# Patient Record
Sex: Female | Born: 1995 | Hispanic: Yes | Marital: Single | Smoking: Never smoker
Health system: Southern US, Community
[De-identification: ages and names within clinical notes are randomized; demographics above are authoritative.]

## PROBLEM LIST (undated history)

## (undated) SURGERY — OPEN REDUCTION INTERNAL FIXATION (ORIF) ANKLE FRACTURE
Anesthesia: Choice | Laterality: Right

---
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
## 2015-06-29 ENCOUNTER — Emergency Department (HOSPITAL_COMMUNITY): Payer: No Typology Code available for payment source

## 2015-06-29 ENCOUNTER — Encounter (HOSPITAL_COMMUNITY): Payer: Self-pay | Admitting: *Deleted

## 2015-06-29 ENCOUNTER — Emergency Department (HOSPITAL_COMMUNITY): Payer: No Typology Code available for payment source | Admitting: Certified Registered"

## 2015-06-29 ENCOUNTER — Encounter (HOSPITAL_COMMUNITY)
Admission: EM | Disposition: A | Discharge status: Home / Self Care | Payer: Self-pay | Source: Home / Self Care | Attending: Emergency Medicine

## 2015-06-29 ENCOUNTER — Observation Stay (HOSPITAL_COMMUNITY)
Admission: EM | Admit: 2015-06-29 | Discharge: 2015-07-01 | Disposition: A | Discharge status: Home / Self Care | Payer: No Typology Code available for payment source | Attending: Orthopedic Surgery | Admitting: Orthopedic Surgery

## 2015-06-29 DIAGNOSIS — R10819 Abdominal tenderness, unspecified site: Secondary | ICD-10-CM | POA: Insufficient documentation

## 2015-06-29 DIAGNOSIS — S82891B Other fracture of right lower leg, initial encounter for open fracture type I or II: Secondary | ICD-10-CM | POA: Diagnosis present

## 2015-06-29 DIAGNOSIS — S82451B Displaced comminuted fracture of shaft of right fibula, initial encounter for open fracture type I or II: Secondary | ICD-10-CM | POA: Diagnosis not present

## 2015-06-29 DIAGNOSIS — S82851B Displaced trimalleolar fracture of right lower leg, initial encounter for open fracture type I or II: Secondary | ICD-10-CM

## 2015-06-29 DIAGNOSIS — Z9889 Other specified postprocedural states: Secondary | ICD-10-CM

## 2015-06-29 DIAGNOSIS — R112 Nausea with vomiting, unspecified: Secondary | ICD-10-CM | POA: Insufficient documentation

## 2015-06-29 DIAGNOSIS — S82401B Unspecified fracture of shaft of right fibula, initial encounter for open fracture type I or II: Secondary | ICD-10-CM | POA: Diagnosis present

## 2015-06-29 HISTORY — PX: ORIF FIBULA FRACTURE: SHX5114

## 2015-06-29 LAB — I-STAT CHEM 8, ED
BUN: 12 mg/dL (ref 6–20)
CALCIUM ION: 1.2 mmol/L (ref 1.12–1.23)
CHLORIDE: 102 mmol/L (ref 101–111)
Creatinine, Ser: 0.9 mg/dL (ref 0.44–1.00)
GLUCOSE: 99 mg/dL (ref 65–99)
HCT: 36 % (ref 36.0–46.0)
Hemoglobin: 12.2 g/dL (ref 12.0–15.0)
Potassium: 4 mmol/L (ref 3.5–5.1)
Sodium: 139 mmol/L (ref 135–145)
TCO2: 25 mmol/L (ref 0–100)

## 2015-06-29 LAB — CBC WITH DIFFERENTIAL/PLATELET
BASOS PCT: 0 %
Basophils Absolute: 0 10*3/uL (ref 0.0–0.1)
EOS PCT: 1 %
Eosinophils Absolute: 0.1 10*3/uL (ref 0.0–0.7)
HCT: 33 % — ABNORMAL LOW (ref 36.0–46.0)
Hemoglobin: 10.2 g/dL — ABNORMAL LOW (ref 12.0–15.0)
LYMPHS ABS: 1.6 10*3/uL (ref 0.7–4.0)
Lymphocytes Relative: 18 %
MCH: 21.1 pg — AB (ref 26.0–34.0)
MCHC: 30.9 g/dL (ref 30.0–36.0)
MCV: 68.2 fL — AB (ref 78.0–100.0)
MONO ABS: 0.5 10*3/uL (ref 0.1–1.0)
Monocytes Relative: 6 %
NEUTROS ABS: 6.7 10*3/uL (ref 1.7–7.7)
Neutrophils Relative %: 75 %
PLATELETS: 239 10*3/uL (ref 150–400)
RBC: 4.84 MIL/uL (ref 3.87–5.11)
RDW: 18.2 % — AB (ref 11.5–15.5)
WBC: 8.9 10*3/uL (ref 4.0–10.5)

## 2015-06-29 LAB — I-STAT BETA HCG BLOOD, ED (MC, WL, AP ONLY)

## 2015-06-29 SURGERY — OPEN REDUCTION INTERNAL FIXATION (ORIF) FIBULA FRACTURE
Anesthesia: General | Site: Leg Lower | Laterality: Right

## 2015-06-29 MED ORDER — PROPOFOL 10 MG/ML IV BOLUS
INTRAVENOUS | Status: DC | PRN
  Administered 2015-06-29: 200 mg via INTRAVENOUS

## 2015-06-29 MED ORDER — ONDANSETRON HCL 4 MG PO TABS
4.0000 mg | ORAL_TABLET | Freq: Four times a day (QID) | ORAL | Status: DC | PRN
  Administered 2015-06-30 (×2): 4 mg via ORAL
  Filled 2015-06-29 (×2): qty 1

## 2015-06-29 MED ORDER — METHOCARBAMOL 1000 MG/10ML IJ SOLN: 500.0000 mg | Freq: Four times a day (QID) | INTRAVENOUS | Status: DC | PRN

## 2015-06-29 MED ORDER — ONDANSETRON HCL 4 MG/2ML IJ SOLN
INTRAMUSCULAR | Status: AC
Start: 2015-06-29 — End: 2015-06-30
  Filled 2015-06-29: qty 2

## 2015-06-29 MED ORDER — OXYCODONE-ACETAMINOPHEN 5-325 MG PO TABS: 1.0000 | ORAL_TABLET | Freq: Four times a day (QID) | ORAL | Status: AC | PRN

## 2015-06-29 MED ORDER — LIDOCAINE HCL (CARDIAC) 20 MG/ML IV SOLN
INTRAVENOUS | Status: DC | PRN
  Administered 2015-06-29: 80 mg via INTRAVENOUS

## 2015-06-29 MED ORDER — HYDROMORPHONE HCL 1 MG/ML IJ SOLN
0.2500 mg | INTRAMUSCULAR | Status: DC | PRN
  Administered 2015-06-29 (×4): 0.5 mg via INTRAVENOUS

## 2015-06-29 MED ORDER — MIDAZOLAM HCL 2 MG/2ML IJ SOLN
INTRAMUSCULAR | Status: AC
  Filled 2015-06-29: qty 4

## 2015-06-29 MED ORDER — PROPOFOL 10 MG/ML IV BOLUS
INTRAVENOUS | Status: AC
  Filled 2015-06-29: qty 20

## 2015-06-29 MED ORDER — KETOROLAC TROMETHAMINE 15 MG/ML IJ SOLN
15.0000 mg | Freq: Four times a day (QID) | INTRAMUSCULAR | Status: AC
  Administered 2015-06-30 – 2015-07-01 (×4): 15 mg via INTRAVENOUS
  Filled 2015-06-29 (×4): qty 1

## 2015-06-29 MED ORDER — HYDROMORPHONE HCL 1 MG/ML IJ SOLN: 0.5000 mg | INTRAMUSCULAR | Status: DC | PRN

## 2015-06-29 MED ORDER — IBUPROFEN 400 MG PO TABS
400.0000 mg | ORAL_TABLET | Freq: Once | ORAL | Status: AC
  Administered 2015-06-29: 400 mg via ORAL
  Filled 2015-06-29: qty 1

## 2015-06-29 MED ORDER — OXYCODONE HCL 5 MG PO TABS
5.0000 mg | ORAL_TABLET | ORAL | Status: DC | PRN
  Administered 2015-06-30 – 2015-07-01 (×6): 5 mg via ORAL
  Filled 2015-06-29 (×6): qty 1

## 2015-06-29 MED ORDER — OXYCODONE HCL 5 MG PO TABS
ORAL_TABLET | ORAL | Status: AC
Start: 2015-06-29 — End: 2015-06-30
  Filled 2015-06-29: qty 1

## 2015-06-29 MED ORDER — ONDANSETRON HCL 4 MG/2ML IJ SOLN
INTRAMUSCULAR | Status: AC
  Filled 2015-06-29: qty 2

## 2015-06-29 MED ORDER — CEFAZOLIN SODIUM-DEXTROSE 2-3 GM-% IV SOLR
INTRAVENOUS | Status: AC
  Filled 2015-06-29: qty 50

## 2015-06-29 MED ORDER — SUCCINYLCHOLINE CHLORIDE 20 MG/ML IJ SOLN
INTRAMUSCULAR | Status: DC | PRN
  Administered 2015-06-29: 100 mg via INTRAVENOUS

## 2015-06-29 MED ORDER — LIDOCAINE HCL (CARDIAC) 20 MG/ML IV SOLN
INTRAVENOUS | Status: AC
  Filled 2015-06-29: qty 5

## 2015-06-29 MED ORDER — ACETAMINOPHEN 325 MG PO TABS: 650.0000 mg | ORAL_TABLET | Freq: Four times a day (QID) | ORAL | Status: DC | PRN

## 2015-06-29 MED ORDER — ACETAMINOPHEN 650 MG RE SUPP: 650.0000 mg | Freq: Four times a day (QID) | RECTAL | Status: DC | PRN

## 2015-06-29 MED ORDER — CEPHALEXIN 500 MG PO CAPS: 500.0000 mg | ORAL_CAPSULE | Freq: Three times a day (TID) | ORAL | Status: AC

## 2015-06-29 MED ORDER — MEPERIDINE HCL 25 MG/ML IJ SOLN: 6.2500 mg | INTRAMUSCULAR | Status: DC | PRN

## 2015-06-29 MED ORDER — HYDROMORPHONE HCL 1 MG/ML IJ SOLN
INTRAMUSCULAR | Status: AC
  Filled 2015-06-29: qty 1

## 2015-06-29 MED ORDER — ASPIRIN EC 325 MG PO TBEC: 325.0000 mg | DELAYED_RELEASE_TABLET | Freq: Every day | ORAL | Status: AC

## 2015-06-29 MED ORDER — OXYCODONE HCL 5 MG PO TABS
5.0000 mg | ORAL_TABLET | Freq: Once | ORAL | Status: AC | PRN
  Administered 2015-06-29: 5 mg via ORAL

## 2015-06-29 MED ORDER — FENTANYL CITRATE (PF) 250 MCG/5ML IJ SOLN
INTRAMUSCULAR | Status: DC | PRN
  Administered 2015-06-29: 100 ug via INTRAVENOUS
  Administered 2015-06-29: 50 ug via INTRAVENOUS

## 2015-06-29 MED ORDER — METHOCARBAMOL 500 MG PO TABS
500.0000 mg | ORAL_TABLET | Freq: Four times a day (QID) | ORAL | Status: DC | PRN
  Administered 2015-07-01: 500 mg via ORAL
  Filled 2015-06-29: qty 1

## 2015-06-29 MED ORDER — OXYCODONE HCL 5 MG/5ML PO SOLN
5.0000 mg | Freq: Once | ORAL | Status: AC | PRN
Start: 2015-06-29 — End: 2015-06-29

## 2015-06-29 MED ORDER — 0.9 % SODIUM CHLORIDE (POUR BTL) OPTIME
TOPICAL | Status: DC | PRN
  Administered 2015-06-29: 1000 mL

## 2015-06-29 MED ORDER — MIDAZOLAM HCL 5 MG/5ML IJ SOLN
INTRAMUSCULAR | Status: DC | PRN
  Administered 2015-06-29: 2 mg via INTRAVENOUS

## 2015-06-29 MED ORDER — METOCLOPRAMIDE HCL 5 MG PO TABS
5.0000 mg | ORAL_TABLET | Freq: Three times a day (TID) | ORAL | Status: DC | PRN
Start: 2015-06-29 — End: 2015-07-01

## 2015-06-29 MED ORDER — KETOROLAC TROMETHAMINE 30 MG/ML IJ SOLN
30.0000 mg | Freq: Once | INTRAMUSCULAR | Status: AC
  Administered 2015-06-29: 30 mg via INTRAVENOUS

## 2015-06-29 MED ORDER — FENTANYL CITRATE (PF) 250 MCG/5ML IJ SOLN
INTRAMUSCULAR | Status: AC
  Filled 2015-06-29: qty 5

## 2015-06-29 MED ORDER — BUPIVACAINE HCL (PF) 0.25 % IJ SOLN
INTRAMUSCULAR | Status: AC
  Filled 2015-06-29: qty 30

## 2015-06-29 MED ORDER — LACTATED RINGERS IV SOLN
INTRAVENOUS | Status: DC | PRN
  Administered 2015-06-29 (×2): via INTRAVENOUS

## 2015-06-29 MED ORDER — IOHEXOL 300 MG/ML  SOLN
100.0000 mL | Freq: Once | INTRAMUSCULAR | Status: AC | PRN
  Administered 2015-06-29: 100 mL via INTRAVENOUS

## 2015-06-29 MED ORDER — METOCLOPRAMIDE HCL 5 MG/ML IJ SOLN
5.0000 mg | Freq: Three times a day (TID) | INTRAMUSCULAR | Status: DC | PRN
  Administered 2015-06-30: 10 mg via INTRAVENOUS
  Filled 2015-06-29: qty 2

## 2015-06-29 MED ORDER — ONDANSETRON HCL 4 MG/2ML IJ SOLN
4.0000 mg | Freq: Once | INTRAMUSCULAR | Status: AC
  Administered 2015-06-29: 4 mg via INTRAVENOUS

## 2015-06-29 MED ORDER — ASPIRIN EC 81 MG PO TBEC
81.0000 mg | DELAYED_RELEASE_TABLET | Freq: Every day | ORAL | Status: DC
  Administered 2015-06-30 – 2015-07-01 (×2): 81 mg via ORAL
  Filled 2015-06-29 (×2): qty 1

## 2015-06-29 MED ORDER — CEFAZOLIN SODIUM 1-5 GM-% IV SOLN
1.0000 g | Freq: Three times a day (TID) | INTRAVENOUS | Status: DC
  Administered 2015-06-29: 2 g via INTRAVENOUS
  Filled 2015-06-29: qty 50

## 2015-06-29 MED ORDER — HYDROMORPHONE HCL 1 MG/ML IJ SOLN
INTRAMUSCULAR | Status: AC
Start: 2015-06-29 — End: 2015-06-30
  Filled 2015-06-29: qty 1

## 2015-06-29 MED ORDER — ONDANSETRON HCL 4 MG/2ML IJ SOLN
4.0000 mg | Freq: Four times a day (QID) | INTRAMUSCULAR | Status: DC | PRN
  Administered 2015-06-30: 4 mg via INTRAVENOUS
  Filled 2015-06-29: qty 2

## 2015-06-29 MED ORDER — KETOROLAC TROMETHAMINE 30 MG/ML IJ SOLN
INTRAMUSCULAR | Status: AC
  Filled 2015-06-29: qty 1

## 2015-06-29 MED ORDER — FENTANYL CITRATE (PF) 100 MCG/2ML IJ SOLN: 25.0000 ug | Freq: Once | INTRAMUSCULAR | Status: DC

## 2015-06-29 MED ORDER — BUPIVACAINE HCL (PF) 0.25 % IJ SOLN
INTRAMUSCULAR | Status: DC | PRN
  Administered 2015-06-29: 30 mL

## 2015-06-29 MED ORDER — ONDANSETRON HCL 4 MG/2ML IJ SOLN
INTRAMUSCULAR | Status: DC | PRN
  Administered 2015-06-29 (×2): 4 mg via INTRAVENOUS

## 2015-06-29 MED ORDER — CEFAZOLIN SODIUM 1-5 GM-% IV SOLN
1.0000 g | Freq: Four times a day (QID) | INTRAVENOUS | Status: DC
  Administered 2015-06-30 – 2015-07-01 (×5): 1 g via INTRAVENOUS
  Filled 2015-06-29 (×7): qty 50

## 2015-06-29 MED ORDER — DEXTROSE-NACL 5-0.45 % IV SOLN
INTRAVENOUS | Status: DC
  Administered 2015-06-30 (×2): via INTRAVENOUS

## 2015-06-29 MED ORDER — TETANUS-DIPHTH-ACELL PERTUSSIS 5-2.5-18.5 LF-MCG/0.5 IM SUSP
0.5000 mL | Freq: Once | INTRAMUSCULAR | Status: AC
  Administered 2015-06-29: 0.5 mL via INTRAMUSCULAR
  Filled 2015-06-29: qty 0.5

## 2015-06-29 SURGICAL SUPPLY — 65 items
BANDAGE ELASTIC 4 VELCRO ST LF (GAUZE/BANDAGES/DRESSINGS) ×3 IMPLANT
BANDAGE ELASTIC 6 VELCRO ST LF (GAUZE/BANDAGES/DRESSINGS) ×3 IMPLANT
BANDAGE ESMARK 6X9 LF (GAUZE/BANDAGES/DRESSINGS) ×1 IMPLANT
BIT DRILL 2.0 (BIT) ×1
BIT DRILL 2.0MM (BIT) ×1
BIT DRILL 2.5X2.75 QC CALB (BIT) ×3 IMPLANT
BIT DRILL 2XNS DISP SS SM FRAG (BIT) ×1 IMPLANT
BIT DRILL 3.5X5.5 QC CALB (BIT) ×3 IMPLANT
BIT DRILL CALIBRATED 2.7 (BIT) ×2 IMPLANT
BIT DRILL CALIBRATED 2.7MM (BIT) ×1
BIT DRL 2XNS DISP SS SM FRAG (BIT) ×1
BLADE SURG 10 STRL SS (BLADE) IMPLANT
BLADE SURG ROTATE 9660 (MISCELLANEOUS) IMPLANT
BNDG ESMARK 6X9 LF (GAUZE/BANDAGES/DRESSINGS) ×3
BNDG GAUZE ELAST 4 BULKY (GAUZE/BANDAGES/DRESSINGS) ×3 IMPLANT
COVER MAYO STAND STRL (DRAPES) ×3 IMPLANT
COVER SURGICAL LIGHT HANDLE (MISCELLANEOUS) ×3 IMPLANT
CUFF TOURNIQUET SINGLE 34IN LL (TOURNIQUET CUFF) ×3 IMPLANT
CUFF TOURNIQUET SINGLE 44IN (TOURNIQUET CUFF) IMPLANT
DRAPE OEC MINIVIEW 54X84 (DRAPES) ×3 IMPLANT
DRAPE U-SHAPE 47X51 STRL (DRAPES) ×3 IMPLANT
DURAPREP 26ML APPLICATOR (WOUND CARE) ×6 IMPLANT
ELECT REM PT RETURN 9FT ADLT (ELECTROSURGICAL) ×3
ELECTRODE REM PT RTRN 9FT ADLT (ELECTROSURGICAL) ×1 IMPLANT
GAUZE SPONGE 4X4 12PLY STRL (GAUZE/BANDAGES/DRESSINGS) ×3 IMPLANT
GAUZE XEROFORM 1X8 LF (GAUZE/BANDAGES/DRESSINGS) ×3 IMPLANT
GLOVE BIOGEL PI IND STRL 8 (GLOVE) ×2 IMPLANT
GLOVE BIOGEL PI INDICATOR 8 (GLOVE) ×4
GLOVE ECLIPSE 7.5 STRL STRAW (GLOVE) ×6 IMPLANT
GOWN STRL REUS W/ TWL LRG LVL3 (GOWN DISPOSABLE) IMPLANT
GOWN STRL REUS W/ TWL XL LVL3 (GOWN DISPOSABLE) ×2 IMPLANT
GOWN STRL REUS W/TWL LRG LVL3 (GOWN DISPOSABLE)
GOWN STRL REUS W/TWL XL LVL3 (GOWN DISPOSABLE) ×4
KIT BASIN OR (CUSTOM PROCEDURE TRAY) ×3 IMPLANT
KIT ROOM TURNOVER OR (KITS) ×3 IMPLANT
MANIFOLD NEPTUNE II (INSTRUMENTS) IMPLANT
NEEDLE HYPO 25GX1X1/2 BEV (NEEDLE) ×3 IMPLANT
NS IRRIG 1000ML POUR BTL (IV SOLUTION) ×3 IMPLANT
PACK ORTHO EXTREMITY (CUSTOM PROCEDURE TRAY) ×3 IMPLANT
PAD ARMBOARD 7.5X6 YLW CONV (MISCELLANEOUS) ×6 IMPLANT
PAD CAST 4YDX4 CTTN HI CHSV (CAST SUPPLIES) ×1 IMPLANT
PADDING CAST COTTON 4X4 STRL (CAST SUPPLIES) ×2
PADDING CAST COTTON 6X4 STRL (CAST SUPPLIES) ×3 IMPLANT
PLATE ACE 100DEG 8HOLE (Plate) ×3 IMPLANT
SCREW CORTICAL 2.7MM 16MM (Screw) ×3 IMPLANT
SCREW CORTICAL 3.5MM  12MM (Screw) ×8 IMPLANT
SCREW CORTICAL 3.5MM  16MM (Screw) ×2 IMPLANT
SCREW CORTICAL 3.5MM 12MM (Screw) ×4 IMPLANT
SCREW CORTICAL 3.5MM 16MM (Screw) ×1 IMPLANT
SCREW NLOCK CANC HEX 4X16 (Screw) ×6 IMPLANT
SPLINT FIBERGLASS 4X30 (CAST SUPPLIES) ×3 IMPLANT
SPONGE LAP 4X18 X RAY DECT (DISPOSABLE) IMPLANT
STAPLER VISISTAT 35W (STAPLE) ×3 IMPLANT
SUCTION FRAZIER TIP 10 FR DISP (SUCTIONS) ×3 IMPLANT
SUT ETHILON 4 0 PS 2 18 (SUTURE) ×3 IMPLANT
SUT FIBERWIRE 2-0 18 17.9 3/8 (SUTURE) ×3
SUT VIC AB 0 CTB1 27 (SUTURE) ×3 IMPLANT
SUT VIC AB 2-0 FS1 27 (SUTURE) ×3 IMPLANT
SUTURE FIBERWR 2-0 18 17.9 3/8 (SUTURE) ×1 IMPLANT
SYR CONTROL 10ML LL (SYRINGE) ×3 IMPLANT
TOWEL OR 17X24 6PK STRL BLUE (TOWEL DISPOSABLE) ×3 IMPLANT
TOWEL OR 17X26 10 PK STRL BLUE (TOWEL DISPOSABLE) ×3 IMPLANT
TUBE CONNECTING 12'X1/4 (SUCTIONS) ×1
TUBE CONNECTING 12X1/4 (SUCTIONS) ×2 IMPLANT
WATER STERILE IRR 1000ML POUR (IV SOLUTION) IMPLANT

## 2015-06-29 NOTE — Anesthesia Procedure Notes (Signed)
Procedure Name: Intubation Date/Time: 06/29/2015 8:46 PM Performed by: Jerilee Hoh Pre-anesthesia Checklist: Patient identified, Emergency Drugs available, Suction available and Patient being monitored Patient Re-evaluated:Patient Re-evaluated prior to inductionOxygen Delivery Method: Circle system utilized Preoxygenation: Pre-oxygenation with 100% oxygen Intubation Type: IV induction, Rapid sequence and Cricoid Pressure applied Laryngoscope Size: Mac and 3 Grade View: Grade I Tube type: Oral Tube size: 7.5 mm Number of attempts: 1 Airway Equipment and Method: Stylet Placement Confirmation: ETT inserted through vocal cords under direct vision,  positive ETCO2 and breath sounds checked- equal and bilateral Secured at: 21 cm Tube secured with: Tape Dental Injury: Teeth and Oropharynx as per pre-operative assessment
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
## 2015-06-29 NOTE — ED Notes (Signed)
Pt requesting pain medication. EDP aware 

## 2015-06-29 NOTE — Discharge Instructions (Signed)
Ambulate Non Weight bearing on your right leg with crutches. Ice and elevate your right leg as much as possible.Displaced Fibular Fracture (Adult, Ankle) Treated With ORIF Your displaced fracture is at the part of the fibula that is located at your ankle. Displaced means that the bone is not lined up correctly. Because of this, the bones must be put back into position with a procedure called open reduction and internal fixation (ORIF). ORIF ankle surgery will provide the best chance for your ankle to heal right and decrease the chances of later arthritis and disability. LET [facility] CARE PROVIDER KNOW ABOUT:  Any allergies you have.  All medicines you are taking, including vitamins, herbs, eye drops, creams, and over-the-counter medicines.  Previous problems you or members of your family have had with the use of anesthetics.  Any blood disorders you have.  Previous surgeries you have had.  Medical conditions you have. RISKS AND COMPLICATIONS Generally, this is a safe procedure. However, as with any procedure, complications can occur. Possible complications include:  Excessive bleeding.  Infection.  Posttraumatic arthritis.  Failure to heal properly resulting in an unstable ankle.  Stiffness of the ankle after the repair. BEFORE THE PROCEDURE  Do not eat or drink after midnight the day before your procedure, or as instructed by your health care provider.  Ask your health care provider about changing or stopping any regular medicines. You may need to stop taking certain medicines up to 1 week before the procedure.  You may have lab tests the morning of the procedure.  Make arrangements for someone to drive you home after your procedure. PROCEDURE  An IV tube will be inserted into one of your veins.  You will receive fluids and medicine to make you relax through this tube.  You will then be given medicines to make you sleep (general anesthetic).  A cut (incision) will be  made on the outside of your ankle to expose the bone and clean it out.  The bone is then aligned back together, and screws and plates are used to hold this in place.  The skin and tissue are sewn back together to cover the repaired bone.  Dressings are placed over your incision. AFTER THE PROCEDURE  You will be taken to a recovery area and monitored until the anesthetic wears off.  You will be given pain medicine to control the pain.  You will be discharged after your pain is controlled and you can drink liquids. Document Released: 10/01/2005 Document Revised: 02/15/2014 Document Reviewed: 04/30/2013 [facility] Patient Information 2015 [place], [place]. This information is not intended to replace advice given to you by your health care provider. Make sure you discuss any questions you have with your health care provider.
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
## 2015-06-29 NOTE — Anesthesia Preprocedure Evaluation (Signed)
Anesthesia Evaluation  Patient identified by MRN, date of birth, ID band Patient awake    Reviewed: Allergy & Precautions, NPO status , Patient's Chart, lab work & pertinent test results  Airway Mallampati: I  TM Distance: >3 FB Neck ROM: Full    Dental  (+) Teeth Intact, Dental Advisory Given   Pulmonary    breath sounds clear to auscultation       Cardiovascular  Rhythm:Regular Rate:Normal     Neuro/Psych    GI/Hepatic   Endo/Other    Renal/GU      Musculoskeletal   Abdominal   Peds  Hematology   Anesthesia Other Findings   Reproductive/Obstetrics                             Anesthesia Physical Anesthesia Plan  ASA: I and emergent  Anesthesia Plan: General   Post-op Pain Management:    Induction: Intravenous, Rapid sequence and Cricoid pressure planned  Airway Management Planned: Oral ETT  Additional Equipment:   Intra-op Plan:   Post-operative Plan: Extubation in OR  Informed Consent: I have reviewed the patients History and Physical, chart, labs and discussed the procedure including the risks, benefits and alternatives for the proposed anesthesia with the patient or authorized representative who has indicated his/her understanding and acceptance.   Dental advisory given  Plan Discussed with: CRNA, Anesthesiologist and Surgeon  Anesthesia Plan Comments:         Anesthesia Quick Evaluation  

## 2015-06-29 NOTE — ED Notes (Signed)
Pt transferred to short stay 

## 2015-06-29 NOTE — Transfer of Care (Signed)
Immediate Anesthesia Transfer of Care Note  Patient: Autumn Hoffman  Procedure(s) Performed: Procedure(s): OPEN REDUCTION INTERNAL FIXATION (ORIF) FIBULA FRACTURE (Right)  Patient Location: PACU  Anesthesia Type:General  Level of Consciousness: awake, alert  and oriented  Airway & Oxygen Therapy: Patient Spontanous Breathing  Post-op Assessment: Report given to RN and Post -op Vital signs reviewed and stable  Post vital signs: Reviewed and stable  Last Vitals:  Filed Vitals:   06/29/15 2221  BP: 104/54  Pulse: 101  Temp: 36.9 C  Resp: 21    Complications: No apparent anesthesia complications
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
## 2015-06-30 ENCOUNTER — Encounter (HOSPITAL_COMMUNITY): Payer: Self-pay | Admitting: Orthopedic Surgery

## 2015-06-30 MED ORDER — PROMETHAZINE HCL 12.5 MG PO TABS: 12.5000 mg | ORAL_TABLET | Freq: Four times a day (QID) | ORAL | Status: AC | PRN
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
## 2015-06-30 NOTE — Progress Notes (Signed)
This RN called ortho tech requesting crutches per Dr orders. States will bring them later on in the morning since patient is resting with eyes closed at this time.

## 2015-06-30 NOTE — Progress Notes (Signed)
Subjective: 1 Day Post-Op Procedure(s) (LRB): OPEN REDUCTION INTERNAL FIXATION (ORIF) FIBULA FRACTURE (Right) Patient reports pain as moderate. Some nausea and vomiting overnight..  Not out of bed yet.   Objective: Vital signs in last 24 hours: Temp:  [97.3 F (36.3 C)-98.4 F (36.9 C)] 97.3 F (36.3 C) (09/15 0453) Pulse Rate:  [73-101] 73 (09/15 0453) Resp:  [14-26] 18 (09/15 0453) BP: (104-122)/(54-82) 117/72 mmHg (09/15 0453) SpO2:  [96 %-100 %] 100 % (09/15 0453) Weight:  [56.7 kg (125 lb)] 56.7 kg (125 lb) (09/14 1600)  Intake/Output from previous day: 09/14 0701 - 09/15 0700 In: 1830 [P.O.:160; I.V.:1520; IV Piggyback:150] Out: 504 [Urine:500; Emesis/NG output:4] Intake/Output this shift:     Recent Labs  06/29/15 1650 06/29/15 1659  HGB 10.2* 12.2    Recent Labs  06/29/15 1650 06/29/15 1659  WBC 8.9  --   RBC 4.84  --   HCT 33.0* 36.0  PLT 239  --     Recent Labs  06/29/15 1659  NA 139  K 4.0  CL 102  BUN 12  CREATININE 0.90  GLUCOSE 99   No results for input(s): LABPT, INR in the last 72 hours. Right ankle exam: Posterior splint is intact.   Dressing is clean and dry.  Moves toes actively.  Normal sensation in the toes.  Splint is clean, without  Evidence of drainage.   Assessment/Plan: 1 Day Post-Op Procedure(s) (LRB): OPEN REDUCTION INTERNAL FIXATION (ORIF) FIBULA FRACTURE (Right)  Plan: Discharge home after 1 more dose  Of IV Ancef given. Medication for nausea. I will give her an Rx for Phenergan as needed for nausea. Ambulate nonweightbearing on the right with crutches. Followup with Dr. Luiz Blare in 2 weeks.   Rebbecca Osuna G 06/30/2015, 8:44 AM
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
## 2015-06-30 NOTE — Progress Notes (Signed)
Orthopedic Tech Progress Note Patient Details:  Autumn Hoffman 03-27-1996 [id number] Crutches delivered to room for use with PT Ortho Devices Type of Ortho Device: Crutches Ortho Device/Splint Interventions: Ordered   [name] 06/30/2015, 7:03 AM

## 2015-06-30 NOTE — Progress Notes (Signed)
Patient on clear liquid diet for breakfast with complaints of nausea, and noted vomiting x 2. Patient given Zofran 4 mg with some relief. Advanced patient to a regular diet, she ate approximately 15% of meal, with complaints of nausea, but no vomiting noted. [place], [place] given order to keep patient in hospital and cancel discharge for today.

## 2015-06-30 NOTE — Anesthesia Postprocedure Evaluation (Signed)
  Anesthesia Post-op Note  Patient: Autumn Hoffman  Procedure(s) Performed: Procedure(s): OPEN REDUCTION INTERNAL FIXATION (ORIF) FIBULA FRACTURE (Right)  Patient Location: PACU  Anesthesia Type: General   Level of Consciousness: awake, alert  and oriented  Airway and Oxygen Therapy: Patient Spontanous Breathing  Post-op Pain: mild  Post-op Assessment: Post-op Vital signs reviewed  Post-op Vital Signs: Reviewed  Last Vitals:  Filed Vitals:   06/30/15 0453  BP: 117/72  Pulse: 73  Temp: 36.3 C  Resp: 18    Complications: No apparent anesthesia complications
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
## 2015-07-01 DIAGNOSIS — R112 Nausea with vomiting, unspecified: Secondary | ICD-10-CM

## 2015-07-01 DIAGNOSIS — Z9889 Other specified postprocedural states: Secondary | ICD-10-CM
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
## 2015-07-01 NOTE — Progress Notes (Signed)
Subjective: 2 Days Post-Op Procedure(s) (LRB): OPEN REDUCTION INTERNAL FIXATION (ORIF) FIBULA FRACTURE (Right) open fracture. Patient reports pain as mild.  She has significant nausea and vomiting yesterday and last night. It was felt that she needed to remain in the hospital to manage that with IV antinausea medication. She also was not safe with physical therapy yesterday she did great well with therapy this morning her nausea is now resolved. And she is ready for discharge.  Objective: Vital signs in last 24 hours: Temp:  [98.6 F (37 C)-98.7 F (37.1 C)] 98.6 F (37 C) (09/16 0653) Pulse Rate:  [64-86] 71 (09/16 0653) Resp:  [16-20] 16 (09/16 0653) BP: (101-113)/(47-61) 103/61 mmHg (09/16 0653) SpO2:  [98 %-100 %] 99 % (09/16 0653)  Intake/Output from previous day: 09/15 0701 - 09/16 0700 In: 120 [P.O.:120] Out: 454 [Urine:450; Emesis/NG output:3; Stool:1] Intake/Output this shift:     Recent Labs  06/29/15 1650 06/29/15 1659  HGB 10.2* 12.2    Recent Labs  06/29/15 1650 06/29/15 1659  WBC 8.9  --   RBC 4.84  --   HCT 33.0* 36.0  PLT 239  --     Recent Labs  06/29/15 1659  NA 139  K 4.0  CL 102  BUN 12  CREATININE 0.90  GLUCOSE 99   No results for input(s): LABPT, INR in the last 72 hours. Right lower extremity exam: Posterior splint is intact. Dressing clean and dry. Moves toes actively. Normal sensation in toes. Good capillary refill.   Assessment/Plan: 2 Days Post-Op Procedure(s) (LRB): OPEN REDUCTION INTERNAL FIXATION (ORIF) FIBULA FRACTURE (Right) open fibula fracture. Plan: Discharge home. Prescriptions given. She will be treated with 10 days of oral Keflex 500 mg 3 times a day. Suggested aspirin 81 mg daily for DVT prophylaxis for 3 weeks. Ambulate with crutches nonweightbearing on the right. Elevate right leg as much as possible. Follow-up with Dr. Luiz Blare in 2 weeks.   BETHUNE,JAMES G 07/01/2015, 10:40 AM

## 2016-06-02 ENCOUNTER — Encounter: Payer: Self-pay | Admitting: *Deleted

## 2016-06-02 ENCOUNTER — Emergency Department: Payer: Self-pay

## 2016-06-02 ENCOUNTER — Emergency Department
Admission: EM | Admit: 2016-06-02 | Discharge: 2016-06-02 | Disposition: A | Discharge status: Home / Self Care | Payer: Self-pay | Attending: Emergency Medicine | Admitting: Emergency Medicine

## 2016-06-02 DIAGNOSIS — W231XXA Caught, crushed, jammed, or pinched between stationary objects, initial encounter: Secondary | ICD-10-CM | POA: Insufficient documentation

## 2016-06-02 DIAGNOSIS — Y9389 Activity, other specified: Secondary | ICD-10-CM | POA: Insufficient documentation

## 2016-06-02 DIAGNOSIS — Y999 Unspecified external cause status: Secondary | ICD-10-CM | POA: Insufficient documentation

## 2016-06-02 DIAGNOSIS — S61309A Unspecified open wound of unspecified finger with damage to nail, initial encounter: Secondary | ICD-10-CM

## 2016-06-02 DIAGNOSIS — S61102A Unspecified open wound of left thumb with damage to nail, initial encounter: Secondary | ICD-10-CM | POA: Insufficient documentation

## 2016-06-02 DIAGNOSIS — Y929 Unspecified place or not applicable: Secondary | ICD-10-CM | POA: Insufficient documentation

## 2016-06-02 DIAGNOSIS — S6992XA Unspecified injury of left wrist, hand and finger(s), initial encounter: Secondary | ICD-10-CM

## 2016-06-02 MED ORDER — LIDOCAINE-EPINEPHRINE-TETRACAINE (LET) SOLUTION
NASAL | Status: AC
  Administered 2016-06-02: 6 mL via TOPICAL
  Filled 2016-06-02: qty 6

## 2016-06-02 MED ORDER — ONDANSETRON 4 MG PO TBDP
4.0000 mg | ORAL_TABLET | Freq: Once | ORAL | Status: AC
  Administered 2016-06-02: 4 mg via ORAL

## 2016-06-02 MED ORDER — OXYCODONE-ACETAMINOPHEN 5-325 MG PO TABS
1.0000 | ORAL_TABLET | Freq: Once | ORAL | Status: AC
  Administered 2016-06-02: 1 via ORAL

## 2016-06-02 MED ORDER — OXYCODONE-ACETAMINOPHEN 5-325 MG PO TABS
ORAL_TABLET | ORAL | Status: AC
  Administered 2016-06-02: 1 via ORAL
  Filled 2016-06-02: qty 1

## 2016-06-02 MED ORDER — ONDANSETRON 4 MG PO TBDP
ORAL_TABLET | ORAL | Status: AC
  Administered 2016-06-02: 4 mg via ORAL
  Filled 2016-06-02: qty 1

## 2016-06-02 MED ORDER — HYDROCODONE-ACETAMINOPHEN 5-325 MG PO TABS: 1.0000 | ORAL_TABLET | Freq: Four times a day (QID) | ORAL | 0 refills | Status: AC | PRN

## 2016-06-02 MED ORDER — IBUPROFEN 600 MG PO TABS: 600.0000 mg | ORAL_TABLET | Freq: Three times a day (TID) | ORAL | 0 refills | Status: AC | PRN

## 2016-06-02 MED ORDER — LIDOCAINE-EPINEPHRINE-TETRACAINE (LET) SOLUTION
6.0000 mL | Freq: Once | NASAL | Status: AC
  Administered 2016-06-02: 6 mL via TOPICAL
  Filled 2016-06-02: qty 6
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
## 2016-06-02 NOTE — ED Notes (Signed)
Pt reports slammed left thumb in car door.  No obvious deformity, swelling noted, nail separated from nail bed, artificial nail in place.  PT NAD at this time, resp equal and unlabored, skin warm and dry.

## 2016-06-02 NOTE — Discharge Instructions (Signed)
1. You may take pain medicines as needed (Motrin/Norco #15). 2. Your nail will follow off in a few weeks. The new nail might grow in wavy. 3. Return to the ER for worsening symptoms, redness/swelling, purulent discharge or other concerns.

## 2016-06-02 NOTE — ED Notes (Signed)
Dr. Dolores FrameSung request that artificial nail be removed.  Finger to be numbed with LET prior to attempting to remove nail w/ acetone.
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
## 2017-10-07 DIAGNOSIS — R87612 Low grade squamous intraepithelial lesion on cytologic smear of cervix (LGSIL): Secondary | ICD-10-CM | POA: Insufficient documentation

## 2019-06-23 LAB — HM PAP SMEAR

## 2019-09-07 ENCOUNTER — Other Ambulatory Visit: Payer: Self-pay

## 2019-09-07 DIAGNOSIS — Z20822 Contact with and (suspected) exposure to covid-19: Secondary | ICD-10-CM

## 2019-09-08 LAB — NOVEL CORONAVIRUS, NAA: SARS-CoV-2, NAA: NOT DETECTED

## 2020-05-25 ENCOUNTER — Ambulatory Visit (LOCAL_COMMUNITY_HEALTH_CENTER): Payer: BC Managed Care – PPO | Admitting: Physician Assistant

## 2020-05-25 ENCOUNTER — Encounter: Payer: Self-pay | Admitting: Physician Assistant

## 2020-05-25 ENCOUNTER — Other Ambulatory Visit: Payer: Self-pay

## 2020-05-25 VITALS — BP 104/58 | Ht 66.0 in | Wt 123.0 lb

## 2020-05-25 DIAGNOSIS — Z30011 Encounter for initial prescription of contraceptive pills: Secondary | ICD-10-CM

## 2020-05-25 DIAGNOSIS — Z3009 Encounter for other general counseling and advice on contraception: Secondary | ICD-10-CM

## 2020-05-25 DIAGNOSIS — Z0000 Encounter for general adult medical examination without abnormal findings: Secondary | ICD-10-CM

## 2020-05-25 MED ORDER — THERA VITAL M PO TABS: 1.0000 | ORAL_TABLET | Freq: Every day | ORAL | 0 refills | Status: AC

## 2020-05-25 MED ORDER — NORGESTIMATE-ETH ESTRADIOL 0.25-35 MG-MCG PO TABS: 1.0000 | ORAL_TABLET | Freq: Every day | ORAL | 12 refills | Status: AC

## 2020-05-25 NOTE — Progress Notes (Signed)
Patient here for [facility]. Last PE at [facility] 2016. States last PE was at [facility] in 2020. States last Pap was normal and was in 2019 or 2020. Would like to start OC today, has taken in past (about 4 years ago).Burt Knack, RN

## 2020-05-25 NOTE — Progress Notes (Signed)
MVI given, OC consent signed, ROI for last pap test results faxed to [facility].Burt Knack, RN

## 2020-05-26 ENCOUNTER — Encounter: Payer: Self-pay | Admitting: Physician Assistant

## 2020-05-26 NOTE — Progress Notes (Signed)
[facility] DEPARTMENT [facility] [address] Main Number: [phone]    Family Planning Visit- Initial Visit  Subjective:  Autumn Hoffman is a 24 y.o.  [id number]   being seen today for an initial well woman visit and to discuss family planning options.  She is currently using Condoms for pregnancy prevention. Patient reports she does not want a pregnancy in the next year.  Patient has the following medical conditions has Open fracture of right fibula; Open right ankle fracture; Postoperative nausea and vomiting; and Low grade squamous intraepithelial lesion on cytologic smear of cervix (LGSIL) on their problem list.  Chief Complaint  Patient presents with  . Contraception    PE and OCs    Patient reports that she would like to get back on OCs for [facility].  States that she took OCs without problems about 4 years ago, but does not remember which OC she took.  States that she had an abnormal pap in 2019 and then a repeat pap in 2020, that was normal.    Patient denies any concerns today.  Denies any contributory personal or family history to prevent being able to take OCs.    Body mass index is 19.85 kg/m. - Patient is eligible for diabetes screening based on BMI and age >74?  not applicable HA1C ordered? not applicable  Patient reports 1 of partners in last year. Desires STI screening?  No - patient declines.  Has patient been screened once for HCV in the past?  No  No results found for: HCVAB  Does the patient have current drug use (including MJ), have a partner with drug use, and/or has been incarcerated since last result? No  If yes-- Screen for HCV through [facility]   Does the patient meet criteria for HBV testing? No  Criteria:  -Household, sexual or needle sharing contact with HBV -History of drug use -HIV positive -Those with known Hep C   Health Maintenance Due  Topic Date Due  . Hepatitis C Screening  Never done  . HIV  Screening  Never done  . PAP-Cervical Cytology Screening  Never done  . PAP SMEAR-Modifier  Never done  . INFLUENZA VACCINE  05/15/2020    Review of Systems  All other systems reviewed and are negative.   The following portions of the patient's history were reviewed and updated as appropriate: allergies, current medications, past family history, past medical history, past social history, past surgical history and problem list. Problem list updated.   See flowsheet for other program required questions.  Objective:   Vitals:   05/25/20 0854  BP: (!) 104/58  Weight: 123 lb (55.8 kg)  Height: 5\' 6"  (1.676 m)    Physical Exam Vitals and nursing note reviewed.  Constitutional:      General: She is not in acute distress.    Appearance: Normal appearance.  HENT:     Head: Normocephalic and atraumatic.  Eyes:     Conjunctiva/sclera: Conjunctivae normal.  Neck:     Thyroid: No thyroid mass, thyromegaly or thyroid tenderness.  Cardiovascular:     Rate and Rhythm: Normal rate and regular rhythm.  Pulmonary:     Effort: Pulmonary effort is normal.     Breath sounds: Normal breath sounds.  Chest:     Breasts:        Right: Normal. No mass, nipple discharge, skin change or tenderness.        Left: Normal. No mass, nipple  discharge, skin change or tenderness.  Abdominal:     Palpations: Abdomen is soft. There is no mass.     Tenderness: There is no abdominal tenderness. There is no guarding or rebound.  Musculoskeletal:     Cervical back: Neck supple.  Lymphadenopathy:     Cervical: No cervical adenopathy.     Upper Body:     Right upper body: No supraclavicular, axillary or pectoral adenopathy.     Left upper body: No supraclavicular, axillary or pectoral adenopathy.  Skin:    General: Skin is warm and dry.     Findings: No bruising, erythema, lesion or rash.  Neurological:     Mental Status: She is alert and oriented to person, place, and time.  Psychiatric:        Mood  and Affect: Mood normal.        Behavior: Behavior normal.        Thought Content: Thought content normal.        Judgment: Judgment normal.       Assessment and Plan:  Autumn Hoffman is a 24 y.o. female presenting to the [facility] Department for an initial well woman exam/family planning visit  Contraception counseling: Reviewed all forms of birth control options in the tiered based approach. available including abstinence; over the counter/barrier methods; hormonal contraceptive medication including pill, patch, ring, injection,contraceptive implant, ECP; hormonal and nonhormonal IUDs; permanent sterilization options including vasectomy and the various tubal sterilization modalities. Risks, benefits, and typical effectiveness rates were reviewed.  Questions were answered.  Written information was also given to the patient to review.  Patient desires to start OCs, this was prescribed for patient. She will follow up in  1 year and prn for surveillance.  She was told to call with any further questions, or with any concerns about this method of contraception.  Emphasized use of condoms 100% of the time for STI prevention.  Patient was not a candidate for ECP today.  1. Encounter for counseling regarding contraception Reviewed with patient how to start OCs and when to call clinic for concerns. Rec condoms with all sex for 2 weeks of first cycle and if late OCs. Counseled that if she starts OCs today, it is possible that she could have some irregular bleeding since she would be starting > 7 days after menses onset, but this is normal and that this may or may not have this happen.   2. Well woman exam (no gynecological exam) Reviewed with patient healthy habits to maintain normal BMI. Enc MVI 1 po daily. Enc to establish with/follow up with PCP for primary care concerns and illness.  - Multiple Vitamins-Minerals (MULTIVITAMIN) tablet; Take 1 tablet by mouth daily.  Dispense: 100  tablet; Refill: 0  3. OCP (oral contraceptive pills) initiation Rx for Sprintec 28 d 1 po daily at the same time each day with refills for 1 year electronically sent to patient's pharmacy of choice. - norgestimate-ethinyl estradiol (SPRINTEC 28) 0.25-35 MG-MCG tablet; Take 1 tablet by mouth daily.  Dispense: 28 tablet; Refill: 12     No follow-ups on file.  No future appointments.  Matt Holmes, PA

## 2020-06-10 ENCOUNTER — Ambulatory Visit (LOCAL_COMMUNITY_HEALTH_CENTER): Payer: Self-pay

## 2020-06-10 ENCOUNTER — Other Ambulatory Visit: Payer: Self-pay

## 2020-06-10 DIAGNOSIS — Z111 Encounter for screening for respiratory tuberculosis: Secondary | ICD-10-CM

## 2020-06-13 ENCOUNTER — Other Ambulatory Visit: Payer: Self-pay

## 2020-06-13 ENCOUNTER — Ambulatory Visit (LOCAL_COMMUNITY_HEALTH_CENTER): Payer: BC Managed Care – PPO

## 2020-06-13 DIAGNOSIS — Z111 Encounter for screening for respiratory tuberculosis: Secondary | ICD-10-CM

## 2020-06-13 LAB — TB SKIN TEST
Induration: 0 mm
TB Skin Test: NEGATIVE

## 2020-07-01 ENCOUNTER — Encounter: Payer: Self-pay | Admitting: Physician Assistant
